# Patient Record
Sex: Male | Born: 1990 | Race: White | Hispanic: No | Marital: Single | Smoking: Current every day smoker
Health system: Southern US, Community
[De-identification: ages and names within clinical notes are randomized; demographics above are authoritative.]

## PROBLEM LIST (undated history)

## (undated) HISTORY — PX: APPENDECTOMY: SHX54

---
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
## 2008-01-12 ENCOUNTER — Emergency Department (HOSPITAL_COMMUNITY): Admission: EM | Admit: 2008-01-12 | Discharge: 2008-01-12 | Payer: Self-pay | Admitting: Emergency Medicine

## 2008-01-14 ENCOUNTER — Encounter (INDEPENDENT_AMBULATORY_CARE_PROVIDER_SITE_OTHER): Payer: Self-pay | Admitting: Otolaryngology

## 2008-01-14 ENCOUNTER — Ambulatory Visit (HOSPITAL_BASED_OUTPATIENT_CLINIC_OR_DEPARTMENT_OTHER): Admission: RE | Admit: 2008-01-14 | Discharge: 2008-01-15 | Payer: Self-pay | Admitting: Otolaryngology

## 2010-01-10 IMAGING — CT CT NECK W/ CM
3 series · 12 of 33 positions shown, 14 images · IV contrast (agent unspecified)
Comparison: None

CLINICAL DATA: Sore throat.  Right neck swelling neck, right neck
swelling neck and dose.

CT NECK WITH CONTRAST
TECHNIQUE: Multidetector CT imaging of the neck was performed with
intravenous contrast.
Contrast: 100 ml [id number] IV.

[Series 2: neck 2.0 b31s · axial · 0.35mm/px · z∈[-234,-52]mm · 4 of 133 slices shown, 5 images]
[im 21/133  soft-tissue]
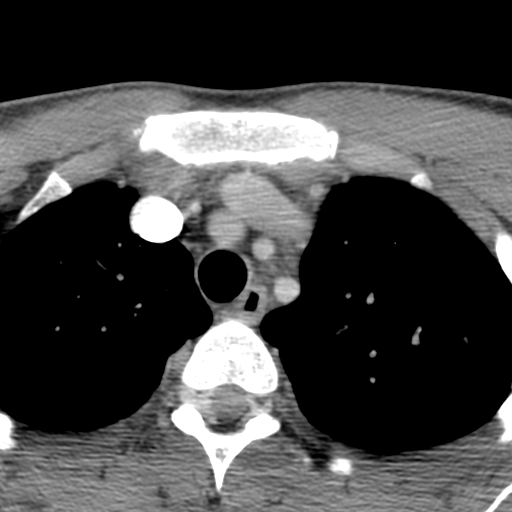
[im 21/133  bone]
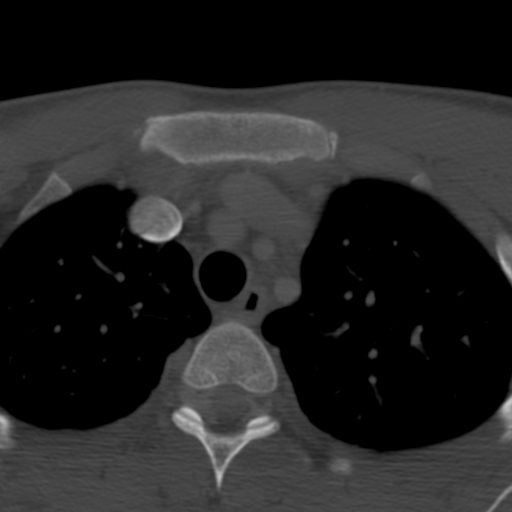
[im 51/133  bone]
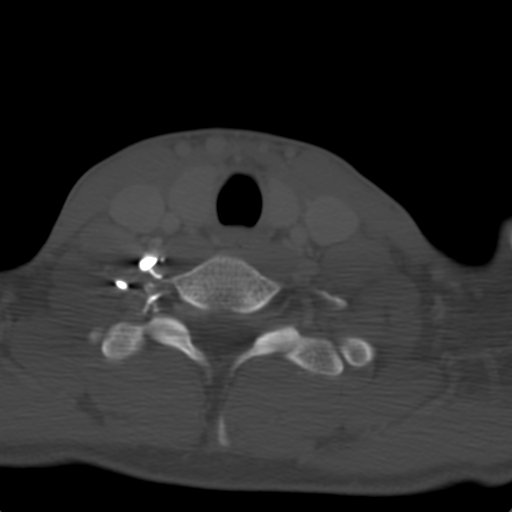
[im 82/133  bone]
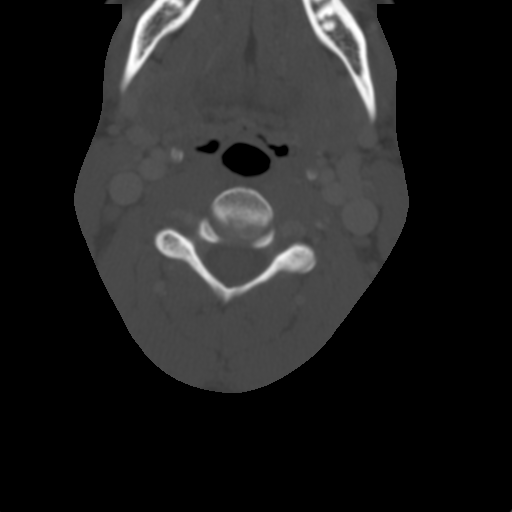
[im 112/133  bone]
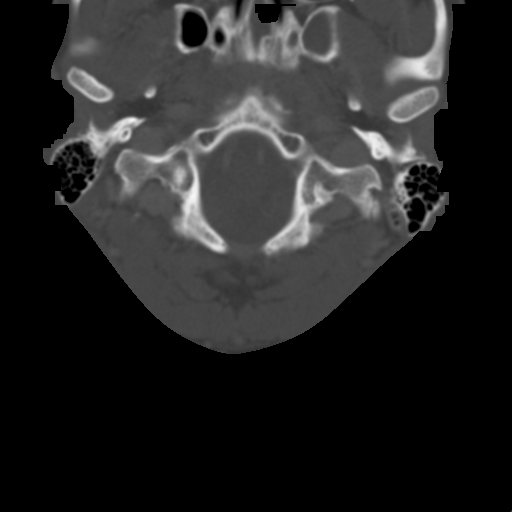

[Series 4: neck 2.0 coronal · coronal · 0.38mm/px · 3 of 88 slices shown]
[im 18/88  bone]
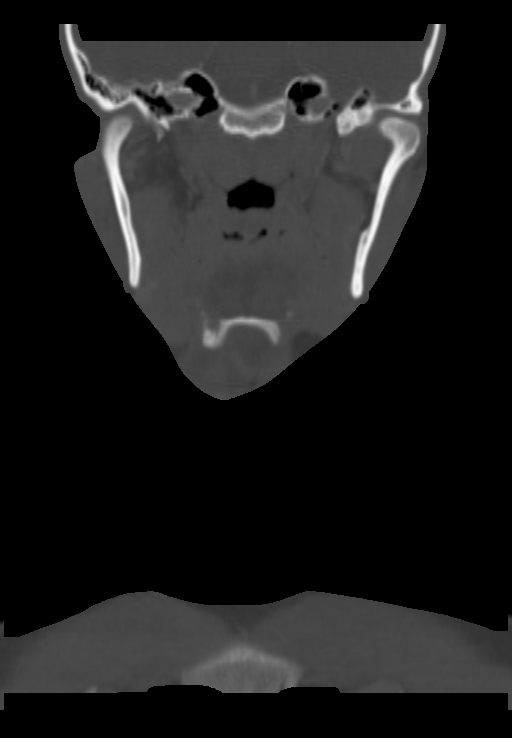
[im 35/88  bone]
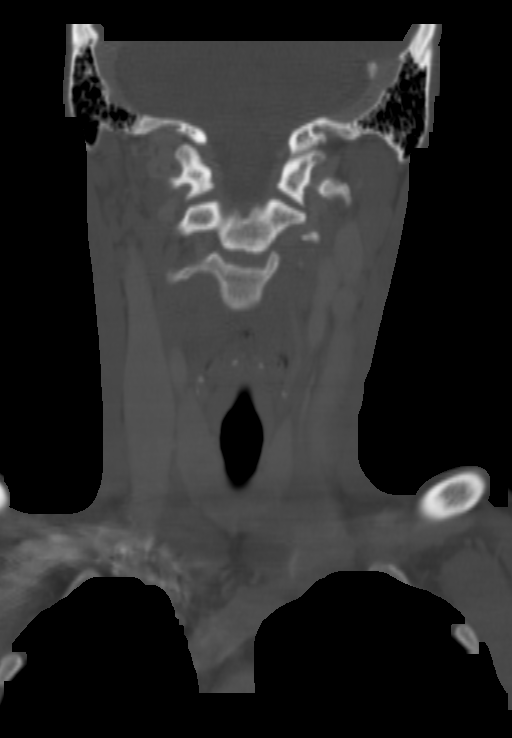
[im 53/88  bone]
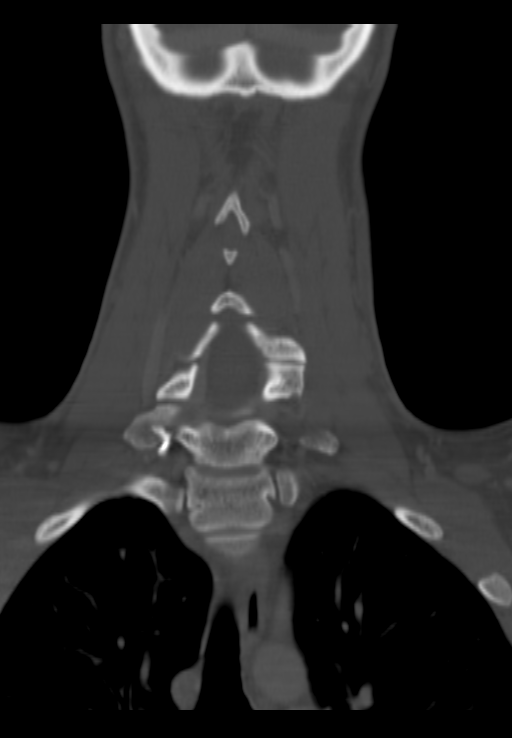

[Series 5: neck 2.0 sagittal · sagittal · 0.38mm/px · 5 of 88 slices shown, 6 images]
[im 30/88  bone]
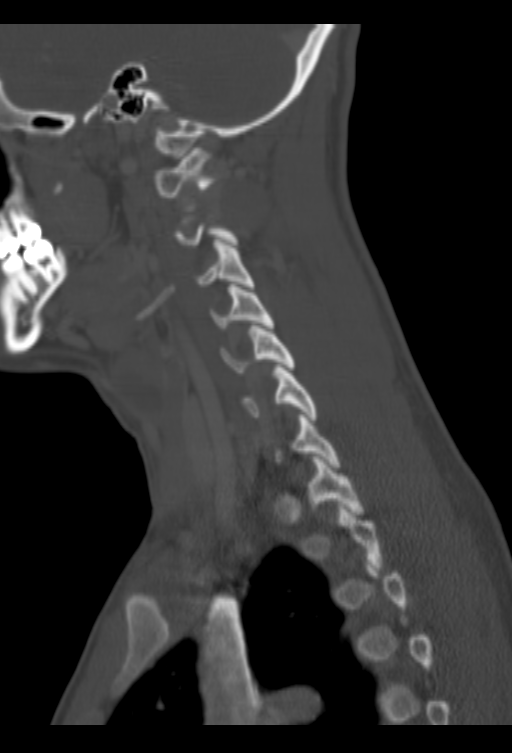
[im 37/88  bone]
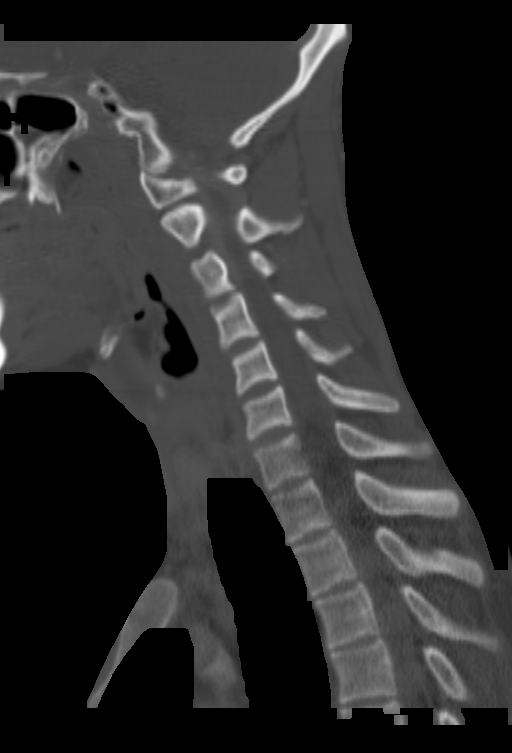
[im 44/88  soft-tissue]
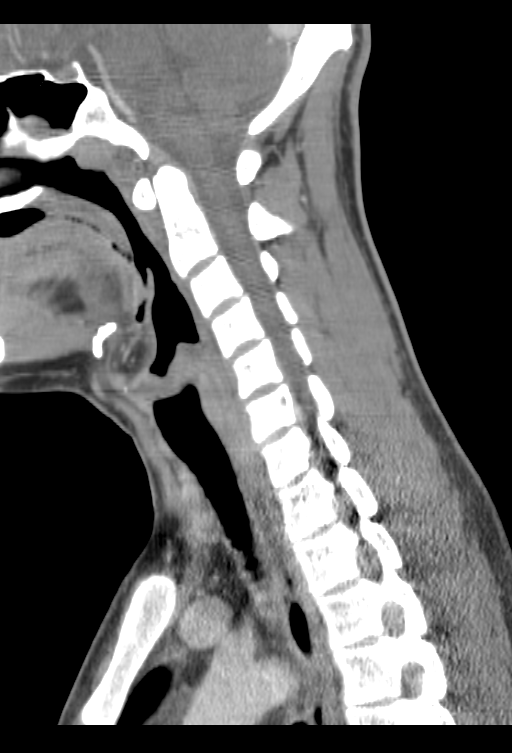
[im 44/88  bone]
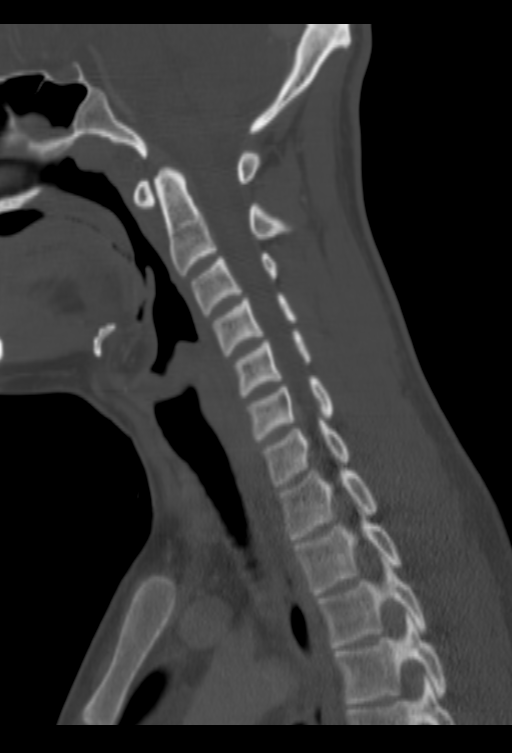
[im 51/88  bone]
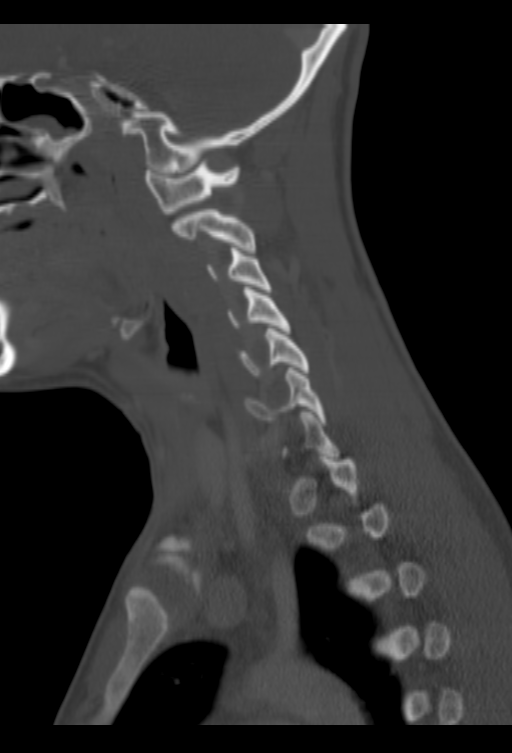
[im 59/88  bone]
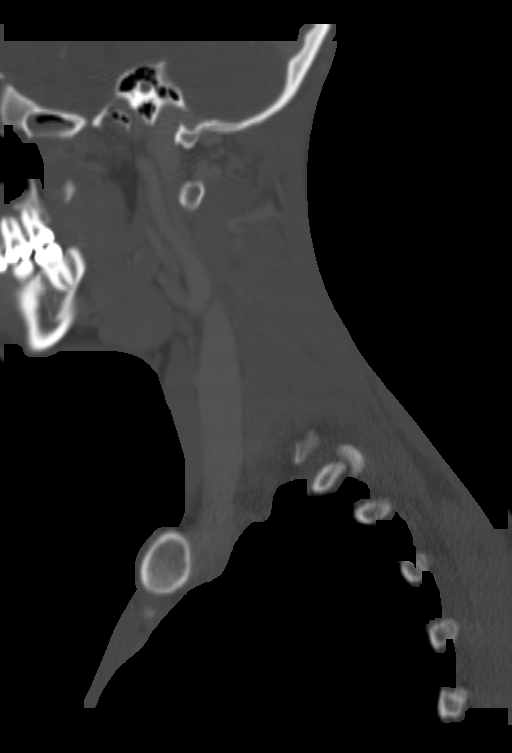

[12 of 33 positions shown; findings below may reference images not displayed]

FINDINGS: There is a cystic mass involving the strap muscles on the
right.  This is a well circumscribed homogeneous cystic mass
measures approximately 17 x 19 mm.  The mass appears is  within the
strap muscles and extends up to the hyoid bone on the right side.
There does appear to be a small point of attachment between this
cyst and the hyoid bone on the inferior surface.  The cystic mass
does not enhance.  There is an adjacent  cystic loculation anterior
to the larger cystic mass which is close to the midline which
measures 7 x 10 mm.  There is no destruction of the hyoid bone.
The larynx appears normal.  The thyroid gland appears to be normal
in size without focal mass lesion.  The base of the tongue is
normal.

There is mild swelling of the pharyngeal tonsils bilaterally
without abscess.  There is cervical lymphadenopathy bilaterally.
Multiple level II, level III, and level IV lymph nodes are
identified.  The largest nodes are level II nodes measuring
approximate 14 mm on the left and 11 mm on the right.  These nodes
are all homogeneous and soft tissue   density.
IMPRESSION: Multilocular cystic mass involving the right neck strap muscles in
the infrahyoid neck.  The largest cystic mass appears to have a
small attachment with the inferior surface of the hyoid bone on the
right.  Another portion the cystic mass extends to the midline.  I
feel this is most likely a thyroglossal duct cyst.  This somewhat
atypical in that it is off the midline and there it has  dissected
into the sternohyoid muscle.  Other less likely possibilities would
be of fluid filled laryngocele, an abscess, and a chronic hematoma.

There is a moderate amount of cervical lymphadenopathy.  These are
most likely reactive nodes related to pharyngitis.  Close clinical
followup is suggested and if these nodes do not resolve then
biopsy is suggested to rule out lymphoma.

## 2010-03-28 ENCOUNTER — Emergency Department (HOSPITAL_COMMUNITY)
Admission: EM | Admit: 2010-03-28 | Discharge: 2010-03-28 | Payer: Self-pay | Source: Home / Self Care | Admitting: Emergency Medicine

## 2010-08-03 NOTE — Op Note (Signed)
Antonio Zimmerman, Antonio Zimmerman               ACCOUNT NO.:  [id number]   MEDICAL RECORD NO.:  [id number]          PATIENT TYPE:  AMB   LOCATION:  DSC                          FACILITY:  [facility]   PHYSICIAN:  Karol T. Lazarus Salines, M.D. DATE OF BIRTH:  04/13/1990   DATE OF PROCEDURE:  01/14/2008  DATE OF DISCHARGE:                               OPERATIVE REPORT   PREOPERATIVE DIAGNOSIS:  Right neck cyst, ?thyroglossal duct versus  external laryngocele.   POSTOPERATIVE DIAGNOSIS:  Thyroglossal duct cyst.   PROCEDURE PERFORMED:  Excision of thyroglossal duct cyst, direct  laryngoscopy.   SURGEON:  Gloris Manchester. Lazarus Salines, MD   ANESTHESIA:  General LMA.   BLOOD LOSS:  Minimal.   COMPLICATIONS:  None.   FINDINGS:  A soft multilobulated cyst lying against the lamina of the  right thyroid cartilage and then present more in the midline at the  thyrohyoid membrane and abutting the central portion of the hyoid bone.  No identified tract.  No evidence of any involvement of the thyrohyoid  membrane.   PROCEDURE:  With the patient in a comfortable supine position, general  mask and intravenous anesthesia was administered.  At an appropriate  level, the table was turned to 90 degrees.  A rubber tooth guard was  placed.  The direct laryngoscope was introduced and the endolarynx was  carefully examined.  There was no fullness in the supraglottis on the  right side and no abnormality in the ventricle.  With compression of the  external cyst, there was no expression of air or fluid.  At this point,  the direct laryngoscopy was completed.  The scope was removed, the tooth  guard was removed, and the dental status was intact.  The patient was  returned to Anesthesia and intubated and anesthesia deepened  accordingly.   At an appropriate level, the patient was placed in a slight reverse  Trendelenburg.  The head was extended for access to the neck.  The neck  was palpated with the findings as described above.  The  CT scans were  also reviewed.  A full sterile preparation and draping of both sides of  the neck was accomplished.   A preexisting skin wrinkle was identified in the surgical field and was  marked and then sharply executed slightly off-centered to the right side  where the mass was.  This was carried down through skin, subcutaneous  fat, and through the platysmal muscle.  Brief planes were developed  superiorly and inferiorly.  The cyst was identified and it was beneath  the strap muscles.  These were elevated in several layers off the cyst  itself and then divided.  Finally in the midline, a free plane was  identified under the cyst and dissection was carried laterally against  the thyroid lamina.  Working inferiorly and then around the lateral  surface, the strap muscles were dissected away from the cyst, which was  then elevated upward.  Finally, the dissection was carried up along the  strap muscles into the base of tongue muscles, which were lysed with the  cutting cautery.  This was  carried down to the hyoid bone just lateral  to the greater horns on both sides.  Upon elevating the cyst adequately  and observing that it was not involved with the thyrohyoid membrane, the  hyoid bone was clipped on both sides using a bone cutter.  The  dissection was carried down through the cut bone into the muscles of the  base of the tongue and then beneath the cyst above the free edge of the  thyroid cartilage.  On the left side, there was a suspicion of  perforation into the vallecula, which was observed later.  The  dissection was carried upward along the inferior portion of the cyst  until the hyoid bone was encountered and then carried into the base of  tongue.  There was no evidence of a tract, and the specimen was removed  en bloc.  A small amount of cautery was required for hemostasis.  The  wound was irrigated and suctioned clear.  With exploration in the  thyrohyoid membrane/vallecular  area in the midline, there was no  evidence of a pharyngeal entry.  A nasogastric tube was placed into the  pharynx and approximately 50 mL of sterile saline was infiltrated with  no loss of saline into the wound.  Air was infiltrated with the same  effect and no identified leak.  At this point, a Valsalva was applied  with some difficulty because the LMA would not hold pressure, but no  evidence of additional bleeding was noted.   The neck was flexed slightly.  The strap muscles were reapproximated in  the midline and then up towards the base of tongue using interrupted 4-0  Vicryl stitches.  A one-quarter-inch Penrose drain was placed into the  area where the mid body of the hyoid bone had been and brought up the  center of the wound.  The wound was closed in the platysma layer with  interrupted 4-0 Vicryl suture.  The drain was secured with a 4-0 nylon  stitch.  The external wound was closed in a cosmetic fashion with a  running simple 5-0 Ethilon.  A small amount of bacitracin ointment was  applied.  A fluff and 3-inch Ace wrap dressing was applied.  At this  point, the procedure was completed.  The patient was returned to  Anesthesia, awakened, extubated, and transferred to recovery in stable  condition.   COMMENT:  A 20 year old white male with a several-month history of a  mass in the right neck, which came up suddenly, but really has not  changed.  A CT scan was interpreted as a branchial cleft cyst, although  my readings suggested either a thyroglossal duct cyst or an external  laryngocele.  The former was identified at today's surgical procedure.  Anticipate a routine postoperative recovery with attention to ice,  elevation, compression dressing x48 hours, and gradual advancement of  diet and activity.       Gloris Manchester. Lazarus Salines, M.D.  Electronically Signed     KTW/MEDQ  D:  01/14/2008  T:  01/14/2008  Job:  [id number]   cc:   Windle Guard, M.D.  Shelda Jakes, MD

## 2010-08-06 NOTE — Op Note (Signed)
NAME:  Antonio Zimmerman, TATAR                         ACCOUNT NO.:  [id number]   MEDICAL RECORD NO.:  [id number]                   PATIENT TYPE:  EMS   LOCATION:  ED                                   FACILITY:  [facility]   PHYSICIAN:  Prabhakar D. Pendse, M.D.           DATE OF BIRTH:  1991/01/31   DATE OF PROCEDURE:  DATE OF DISCHARGE:  11/13/2001                                 OPERATIVE REPORT   PREOPERATIVE DIAGNOSIS:  Acute appendicitis.   POSTOPERATIVE DIAGNOSIS:  Acute appendicitis.   OPERATION PERFORMED:  Exploratory laparotomy and appendectomy.   SURGEON:  Prabhakar D. Pendse, M.D.   ASSISTANT:  None.   ANESTHESIA:  General endotracheal anesthesia.   INDICATION:  The patient is a 20 year-old, somewhat obese boy was seen with  about a 24 hour history of progressively worse lower abdominal pain  associated with nausea and bilious vomiting though no history of URI and no  diarrhea.  Physical findings were consistent with localizing right lower  quadrant pain.  White count was 23,200 with a shift to the left.  Urinalysis  was normal.  The diagnosis of acute appendicitis was made.   OPERATIVE FINDINGS:  Exploration of the right lower quadrant area showed  omentum to be in the right lower quadrant area covering the appendix which  was somewhat boggy, distended, migration and discoloration measuring about  three inches long with moderate quantity of purulent fluid in the right  lower quadrant area.  There was no odor  noted.  The findings are consistent  with acute gangrenous appendicitis with small perforation and further  exploration was not carried out on account of the patient's obesity.   OPERATIVE PROCEDURE:  Under satisfactory general endotracheal anesthesia  with the patient in the supine position the abdomen and groin regions were  thoroughly prepped and draped in the usual manner. About a two inch long  transverse incision was made in the right lower quadrant.  The area of  skin  and subcutaneous tissue was incised. Bleeders were serially clamped, cut and  electrocoagulated. The muscles were incised in the McBurney fascia.  The  peritoneal cavity was entered dividing __________ as described above.  Retractors were placed and by manipulation the appendix was exteriorized. A  moderate quantity of purulent material was seen which was collected for  culture examination.  The appendiceal mesentery was serially clamped, cut  and ligated with 2-0 silk.  The appendectomy was done in the routine  fashion.  The stump was suture ligated with 3-0 silk and two large Hemoclips  were placed.  The stump was not buried in the cecal wall, once again because  of difficulty in exteriorizing the cecum.  The bowel was returned to the  peritoneal cavity. The area was irrigated with saline.  Hemostasis was  satisfactory.  Sponge and needle counts being correct closure was done with  2-0 Vicryl running interlocking sutures. The wound was  irrigated with saline  and muscles approximated with 2-0 Vicryl interrupted sutures. The  subcutaneous tissues closed with 2-0 Vicryl and the skin was closed with 4-0  Monocryl subcuticular sutures.  Steri-  Strips were applied and the appropriate dressing was applied.  Throughout  the procedure the patient's vital signs remained stable.  The patient  withstood the procedure well and was transferred to the recovery room in  satisfactory general condition.                                               Prabhakar D. Levie Heritage, M.D.    PDP/MEDQ  D:  11/13/2001  T:  11/16/2001  Job:  [id number]   cc:   Smitty Cords. Beverely Pace, M.D.

## 2010-12-20 LAB — POCT HEMOGLOBIN-HEMACUE: Hemoglobin: 13.3
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
## 2019-03-07 ENCOUNTER — Emergency Department
Admission: EM | Admit: 2019-03-07 | Discharge: 2019-03-07 | Disposition: A | Discharge status: Home / Self Care | Payer: Self-pay | Source: Home / Self Care

## 2019-03-07 ENCOUNTER — Emergency Department (INDEPENDENT_AMBULATORY_CARE_PROVIDER_SITE_OTHER): Payer: Self-pay

## 2019-03-07 ENCOUNTER — Other Ambulatory Visit: Payer: Self-pay

## 2019-03-07 DIAGNOSIS — M7989 Other specified soft tissue disorders: Secondary | ICD-10-CM

## 2019-03-07 DIAGNOSIS — M25571 Pain in right ankle and joints of right foot: Secondary | ICD-10-CM
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
## 2019-03-07 NOTE — Discharge Instructions (Addendum)
Return if any problems. Follow up with Dr. Darene Lamer in one week if apin persit
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
